# Patient Record
Sex: Female | Born: 1995
Health system: Southern US, Community
[De-identification: ages and names within clinical notes are randomized; demographics above are authoritative.]

## PROBLEM LIST (undated history)

## (undated) DIAGNOSIS — J45909 Unspecified asthma, uncomplicated: Secondary | ICD-10-CM

## (undated) DIAGNOSIS — E282 Polycystic ovarian syndrome: Secondary | ICD-10-CM

## (undated) DIAGNOSIS — H7293 Unspecified perforation of tympanic membrane, bilateral: Secondary | ICD-10-CM

## (undated) DIAGNOSIS — J329 Chronic sinusitis, unspecified: Secondary | ICD-10-CM

## (undated) DIAGNOSIS — Q386 Other congenital malformations of mouth: Secondary | ICD-10-CM

## (undated) HISTORY — DX: Unspecified perforation of tympanic membrane, bilateral: H72.93

## (undated) HISTORY — DX: Chronic sinusitis, unspecified: J32.9

## (undated) HISTORY — DX: Other congenital malformations of mouth: Q38.6

## (undated) HISTORY — DX: Unspecified asthma, uncomplicated: J45.909

## (undated) HISTORY — DX: Polycystic ovarian syndrome: E28.2

---

## 2015-08-13 ENCOUNTER — Other Ambulatory Visit (HOSPITAL_COMMUNITY)
Admission: RE | Admit: 2015-08-13 | Discharge: 2015-08-13 | Disposition: A | Discharge status: Home / Self Care | Payer: BLUE CROSS/BLUE SHIELD | Source: Ambulatory Visit | Attending: Obstetrics and Gynecology | Admitting: Obstetrics and Gynecology

## 2015-08-13 DIAGNOSIS — Z01419 Encounter for gynecological examination (general) (routine) without abnormal findings: Secondary | ICD-10-CM | POA: Insufficient documentation

## 2015-08-13 DIAGNOSIS — E282 Polycystic ovarian syndrome: Secondary | ICD-10-CM

## 2015-08-13 HISTORY — DX: Polycystic ovarian syndrome: E28.2

## 2015-08-20 DIAGNOSIS — Q386 Other congenital malformations of mouth: Secondary | ICD-10-CM

## 2015-08-20 HISTORY — DX: Other congenital malformations of mouth: Q38.6

## 2015-11-13 DIAGNOSIS — J45909 Unspecified asthma, uncomplicated: Secondary | ICD-10-CM

## 2015-11-13 HISTORY — DX: Unspecified asthma, uncomplicated: J45.909

## 2016-09-21 ENCOUNTER — Other Ambulatory Visit: Payer: Self-pay | Admitting: Family Medicine

## 2016-09-21 ENCOUNTER — Ambulatory Visit
Admission: RE | Admit: 2016-09-21 | Discharge: 2016-09-21 | Disposition: A | Discharge status: Home / Self Care | Payer: BLUE CROSS/BLUE SHIELD | Source: Ambulatory Visit | Attending: Family Medicine | Admitting: Family Medicine

## 2016-09-21 DIAGNOSIS — R079 Chest pain, unspecified: Secondary | ICD-10-CM

## 2017-04-21 DIAGNOSIS — H7293 Unspecified perforation of tympanic membrane, bilateral: Secondary | ICD-10-CM

## 2017-04-21 DIAGNOSIS — J329 Chronic sinusitis, unspecified: Secondary | ICD-10-CM

## 2017-04-21 HISTORY — DX: Chronic sinusitis, unspecified: J32.9

## 2017-04-21 HISTORY — DX: Unspecified perforation of tympanic membrane, bilateral: H72.93

## 2017-05-09 DIAGNOSIS — H7293 Unspecified perforation of tympanic membrane, bilateral: Secondary | ICD-10-CM | POA: Diagnosis not present

## 2017-05-09 DIAGNOSIS — H900 Conductive hearing loss, bilateral: Secondary | ICD-10-CM | POA: Diagnosis not present

## 2017-05-29 ENCOUNTER — Encounter: Payer: Self-pay | Admitting: Physical Therapy

## 2017-05-29 ENCOUNTER — Ambulatory Visit: Payer: BC Managed Care – PPO | Admitting: Physical Therapy

## 2017-05-29 DIAGNOSIS — M62830 Muscle spasm of back: Secondary | ICD-10-CM

## 2017-05-29 DIAGNOSIS — G8929 Other chronic pain: Secondary | ICD-10-CM

## 2017-05-29 DIAGNOSIS — M25511 Pain in right shoulder: Secondary | ICD-10-CM

## 2017-05-29 DIAGNOSIS — M25512 Pain in left shoulder: Secondary | ICD-10-CM | POA: Diagnosis not present

## 2017-05-30 NOTE — Therapy (Signed)
[facility] [address] [place], [place], [address] Phone: [phone]   Fax:  [phone]  Physical Therapy Evaluation  Patient Details  Name: Abigail PerkingKristina Wood MRN: [id number] Date of Birth: 04/12/1996 Referring Provider: Mila PalmerSharon Wolters   Encounter Date: 05/29/2017  PT End of Session - 05/30/17 1224    Visit Number  1    Number of Visits  12    Date for PT Re-Evaluation  07/10/17    Authorization Type  BCBS    PT Start Time  1600    PT Stop Time  1653    PT Time Calculation (min)  53 min    Activity Tolerance  Patient tolerated treatment well    Behavior During Therapy  [facility] for tasks assessed/performed       History reviewed. No pertinent past medical history.  History reviewed. No pertinent surgical history.  There were no vitals filed for this visit.   Subjective Assessment - 05/29/17 1607    Subjective  Pt initially had pain when she was 22 y/o, did have PT then, no significant injury to report. She is working as a Runner, broadcasting/film/videoteacher . She is R handed. States increased soreness in R upper trap region. Unable to pinpoint what brings pain on. She has seen PCP only for this issue .     Limitations  Reading;Lifting;Writing;House hold activities    Patient Stated Goals  Decreased pain     Currently in Pain?  Yes    Pain Score  7     Pain Location  Shoulder    Pain Orientation  Right    Pain Descriptors / Indicators  Tightness;Tender    Pain Type  Chronic pain    Pain Onset  More than a month ago    Pain Frequency  Intermittent         [facility] - 05/30/17 1227      Assessment   Medical Diagnosis  R shoulder pain     Referring Provider  Mila PalmerSharon Wolters    Hand Dominance  Right      Precautions   Precautions  None      Restrictions   Weight Bearing Restrictions  No      Balance Screen   Has the patient fallen in the past 6 months  No      Prior Function   Level of Independence  Independent      Cognition    Overall Cognitive Status  Within Functional Limits for tasks assessed      Posture/Postural Control   Posture Comments  Palpable trigger point in R upper trap, tightness and restrictions in upper trap, levator and scalenes.        ROM / Strength   AROM / PROM / Strength  AROM;Strength      AROM   Overall AROM Comments  Cervical ROM: mild limitation for R rotation and R SB      Strength   Overall Strength Comments  UE strength: 4/5             Objective measurements completed on examination: See above findings.      [facility] Treatment/Exercise - 05/30/17 1227      Exercises   Exercises  Neck      Neck Exercises: Standing   Other Standing Exercises  Rows x20      Neck Exercises: Seated   Neck Retraction  20 reps    Shoulder Rolls  20 reps  Manual Therapy   Manual Therapy  Joint mobilization;Soft tissue mobilization;Taping;Passive ROM;Manual Traction    Joint Mobilization  PA mobs-cervical    Soft tissue mobilization  DTM to R UT    Passive ROM  UT stretches    Manual Traction  10 sec x5    Kinesiotex  Inhibit Muscle R UT inhibition      Neck Exercises: Stretches   Upper Trapezius Stretch  3 reps;30 seconds    Levator Stretch  3 reps;30 seconds             PT Education - 05/30/17 1223    Education provided  Yes    Education Details  HEP, use of Heat, posture     Person(s) Educated  Patient    Methods  Explanation;Verbal cues;Handout    Comprehension  Verbalized understanding       PT Short Term Goals - 05/29/17 1646      PT SHORT TERM GOAL #1   Title  Pt to be independent with HEP    Time  2    Period  Weeks    Status  New    Target Date  06/12/17      PT SHORT TERM GOAL #2   Title  Pt to report decreased pain to 4/10     Time  2    Period  Weeks    Status  New    Target Date  06/12/17        PT Long Term Goals - 05/29/17 1646      PT LONG TERM GOAL #1   Title  Pt to report decreased pain to 0-2/10 in R cervical region     Time  6    Period  Weeks    Status  New    Target Date  07/10/17      PT LONG TERM GOAL #2   Title  Pt to demo improved cervical ROM, to be WNL and pain free , to improve abilitiy for work duties     Time  6    Period  Weeks    Status  New    Target Date  07/10/17      PT LONG TERM GOAL #3   Title  Pt to demo decreased tenderness and presence of trigger points at R upper trap region    Time  6    Period  Weeks    Status  New    Target Date  07/10/17      PT LONG TERM GOAL #4   Title  Pt to demo improved shoulder and scapular strength to be at least 4+/5 to improve stability and pain.     Time  6    Period  Weeks    Status  New    Target Date  07/10/17             Plan - 05/30/17 1225    Clinical Impression Statement  Clinical Impression Statement   Pt presents with primary complaint of increased pain in R upper trap region. She has mild ROM loss in c-spine, due to pain, and poor strength for postural muscles. She has painful trigger points in R upper trap, and hypertonicity of upper trap, levator, and scalenes. Pt with decreased ability for full functional activities with UE, due to pain, lifting, carrying, and work duties. Pto benefit from skilled physical therapy to address above deficits and return to PLOF without pain. Prognosis for recovery is good. Home exercise program issued  today.     Clinical Presentation  Stable    Clinical Decision Making  Low    Rehab Potential  Good    PT Frequency  2x / week    PT Duration  6 weeks    PT Treatment/Interventions  ADLs/Self Care Home Management;Cryotherapy;Electrical Stimulation;Iontophoresis 4mg /ml Dexamethasone;Moist Heat;Therapeutic exercise;Therapeutic activities;Functional mobility training;Ultrasound;Neuromuscular re-education;Patient/family education;Manual techniques;Taping;Dry needling;Passive range of motion    Consulted and Agree with Plan of Care  Patient       Patient will benefit from skilled therapeutic  intervention in order to improve the following deficits and impairments:  Hypomobility, Decreased strength, Pain, Increased muscle spasms, Decreased mobility, Decreased range of motion, Postural dysfunction, Improper body mechanics, Impaired flexibility  Visit Diagnosis: Muscle spasm of back - Plan: PT plan of care cert/re-cert  Chronic right shoulder pain - Plan: PT plan of care cert/re-cert     Problem List There are no active problems to display for this patient.   Sedalia Muta, PT, DPT 2:17 PM  05/30/17    [facility] [address] [place], [place], [address] Phone: [phone]   Fax:  [phone]  Name: Abigail Wood MRN: [id number] Date of Birth: 1996/04/15

## 2017-06-05 ENCOUNTER — Ambulatory Visit: Payer: BC Managed Care – PPO | Admitting: Physical Therapy

## 2017-06-05 DIAGNOSIS — M62830 Muscle spasm of back: Secondary | ICD-10-CM | POA: Diagnosis not present

## 2017-06-05 DIAGNOSIS — G8929 Other chronic pain: Secondary | ICD-10-CM | POA: Diagnosis not present

## 2017-06-05 DIAGNOSIS — M25511 Pain in right shoulder: Secondary | ICD-10-CM | POA: Diagnosis not present

## 2017-06-05 NOTE — Therapy (Signed)
[facility] [address] [place], [place], [address] Phone: [phone]   Fax:  [phone]  Physical Therapy Treatment  Patient Details  Name: Abigail Wood MRN: [id number] Date of Birth: 05/15/1995 Referring Provider: Mila PalmerSharon Wolters   Encounter Date: 06/05/2017  PT End of Session - 06/05/17 2037    Visit Number  2    Number of Visits  12    Date for PT Re-Evaluation  07/10/17    Authorization Type  BCBS    PT Start Time  1515    PT Stop Time  1605    PT Time Calculation (min)  50 min    Activity Tolerance  Patient tolerated treatment well    Behavior During Therapy  [facility] for tasks assessed/performed       No past medical history on file.  No past surgical history on file.  There were no vitals filed for this visit.  Subjective Assessment - 06/05/17 2035    Subjective  Pt states minimal pain in last few days. She has been doing HEP ,states increased soreness mostly with stress at work.     Currently in Pain?  No/denies                      [facility]/Ex[facility] - 06/05/17 1521      Posture/Postural Control   Posture Comments  --      Exercises   Exercises  Neck;Shoulder      Neck Exercises: Standing   Other Standing Exercises  --      Neck Exercises: Seated   Neck Retraction  20 reps    Shoulder Rolls  --      Shoulder Exercises: Supine   Flexion  AROM;Limitations    Flexion Limitations  cane      Shoulder Exercises: Prone   Extension  15 reps    Horizontal ABduction 1  15 reps      Shoulder Exercises: Sidelying   ABduction  15 reps      Shoulder Exercises: Standing   Row  20 reps;Theraband    Theraband Level (Shoulder Row)  Level 2 (Red)    Other Standing Exercises  Wall Push ups x20    Other Standing Exercises  Reverse Fly YTB 2x10      Shoulder Exercises: Pulleys   Flexion  2 minutes      Manual Therapy   Manual Therapy  Joint mobilization;Soft tissue  mobilization;Taping;Passive ROM;Manual Traction    Joint Mobilization  PA mobs-cervical    Soft tissue mobilization  DTM to R UT and parasinals, sub occipital release    Passive ROM  --    Manual Traction  10 sec x5    Kinesiotex  Inhibit Muscle R UT inhibition      Kinesiotix   Inhibit Muscle   I Strip R UT      Neck Exercises: Stretches   Upper Trapezius Stretch  3 reps;30 seconds    Levator Stretch  3 reps;30 seconds             PT Education - 06/05/17 2037    Education provided  Yes    Education Details  Posture    Methods  Explanation    Comprehension  Verbalized understanding       PT Short Term Goals - 05/29/17 1646      PT SHORT TERM GOAL #1   Title  Pt to be independent with HEP    Time  2    Period  Weeks    Status  New    Target Date  06/12/17      PT SHORT TERM GOAL #2   Title  Pt to report decreased pain to 4/10     Time  2    Period  Weeks    Status  New    Target Date  06/12/17        PT Long Term Goals - 05/29/17 1646      PT LONG TERM GOAL #1   Title  Pt to report decreased pain to 0-2/10 in R cervical region    Time  6    Period  Weeks    Status  New    Target Date  07/10/17      PT LONG TERM GOAL #2   Title  Pt to demo improved cervical ROM, to be WNL and pain free , to improve abilitiy for work duties     Time  6    Period  Weeks    Status  New    Target Date  07/10/17      PT LONG TERM GOAL #3   Title  Pt to demo decreased tenderness and presence of trigger points at R upper trap region    Time  6    Period  Weeks    Status  New    Target Date  07/10/17      PT LONG TERM GOAL #4   Title  Pt to demo improved shoulder and scapular strength to be at least 4+/5 to improve stability and pain.     Time  6    Period  Weeks    Status  New    Target Date  07/10/17            Plan - 06/05/17 2039    Clinical Impression Statement  Pt with painful trigger point in R upper trap, addressed with manual and taping today, pt  may benefit from dry needling in future (if she can work out session with her schedule). Pt educated on importance of posture during work and shoulder AROM. Pt with difficulty not getting compensation from UT with shoulder elevation. Weakness also noted in scapular muscles, will benefit from continued practice with this.     Rehab Potential  Good    PT Frequency  2x / week    PT Duration  6 weeks    PT Treatment/Interventions  ADLs/Self Care Home Management;Cryotherapy;Electrical Stimulation;Iontophoresis 4mg /ml Dexamethasone;Moist Heat;Therapeutic exercise;Therapeutic activities;Functional mobility training;Ultrasound;Neuromuscular re-education;Patient/family education;Manual techniques;Taping;Dry needling;Passive range of motion    Consulted and Agree with Plan of Care  Patient       Patient will benefit from skilled therapeutic intervention in order to improve the following deficits and impairments:  Hypomobility, Decreased strength, Pain, Increased muscle spasms, Decreased mobility, Decreased range of motion, Postural dysfunction, Improper body mechanics, Impaired flexibility  Visit Diagnosis: Muscle spasm of back  Chronic right shoulder pain     Problem List There are no active problems to display for this patient.  Sedalia Muta, PT, DPT 8:42 PM  06/05/17    [facility] [address] [place], [place], [address] Phone: [phone]   Fax:  [phone]  Name: Abigail Wood MRN: [id number] Date of Birth: 1995-05-27

## 2017-06-12 ENCOUNTER — Encounter: Payer: Self-pay | Admitting: Physical Therapy

## 2017-06-12 ENCOUNTER — Ambulatory Visit: Payer: BC Managed Care – PPO | Admitting: Physical Therapy

## 2017-06-12 DIAGNOSIS — M62830 Muscle spasm of back: Secondary | ICD-10-CM

## 2017-06-12 DIAGNOSIS — G8929 Other chronic pain: Secondary | ICD-10-CM | POA: Diagnosis not present

## 2017-06-12 DIAGNOSIS — M25511 Pain in right shoulder: Secondary | ICD-10-CM

## 2017-06-12 NOTE — Therapy (Signed)
[facility] [address] [place], [place], [address] Phone: [phone]   Fax:  [phone]  Physical Therapy Treatment  Patient Details  Name: Abigail PerkingKristina Selke MRN: [id number] Date of Birth: 03/28/1996 Referring Provider: Mila PalmerSharon Wolters   Encounter Date: 06/12/2017  PT End of Session - 06/12/17 1529    Visit Number  3    Number of Visits  12    Date for PT Re-Evaluation  07/10/17    Authorization Type  BCBS    PT Start Time  1520    PT Stop Time  1618    PT Time Calculation (min)  58 min    Activity Tolerance  Patient tolerated treatment well    Behavior During Therapy  [facility] for tasks assessed/performed       History reviewed. No pertinent past medical history.  History reviewed. No pertinent surgical history.  There were no vitals filed for this visit.  Subjective Assessment - 06/12/17 1527    Subjective  Pt states mild soreness on L side of neck.     Currently in Pain?  No/denies    Pain Score  0-No pain    Pain Descriptors / Indicators  Tightness;Tender    Pain Type  Chronic pain    Pain Onset  More than a month ago    Pain Frequency  Intermittent                      [facility] Adult PT Treatment/Exercise - 06/12/17 1518      Exercises   Exercises  Neck;Shoulder      Neck Exercises: Seated   Neck Retraction  20 reps      Shoulder Exercises: Supine   Flexion  AROM;Limitations;20 reps    Shoulder Flexion Weight (lbs)  2    Flexion Limitations  --      Shoulder Exercises: Prone   Retraction  20 reps;Weights    Retraction Weight (lbs)  3    Extension  20 reps    Horizontal ABduction 1  20 reps      Shoulder Exercises: Sidelying   ABduction  20 reps    Other Sidelying Exercises  Horizontal ABD x15      Shoulder Exercises: Standing   Flexion  20 reps    ABduction  10 reps    Row  20 reps;Theraband    Theraband Level (Shoulder Row)  Level 2 (Red)    Other Standing Exercises  Wall Push ups x20    Other Standing Exercises  Reverse Fly YTB 2x10      Shoulder Exercises: Pulleys   Flexion  2 minutes      Manual Therapy   Manual Therapy  Joint mobilization;Soft tissue mobilization;Taping;Passive ROM;Manual Traction    Joint Mobilization  PA mobs-cervical    Soft tissue mobilization  DTM to B UT and parasinals, sub occipital release    Passive ROM  UT and levator stretches    Manual Traction  10 sec x5    Kinesiotex  -- R UT inhibition      Kinesiotix   Inhibit Muscle   --      Neck Exercises: Stretches   Upper Trapezius Stretch  --    Levator Stretch  --             PT Education - 06/12/17 1529    Education provided  Yes    Education Details  Posture, HEP     Person(s) Educated  Patient  Methods  Explanation    Comprehension  Verbalized understanding       PT Short Term Goals - 05/29/17 1646      PT SHORT TERM GOAL #1   Title  Pt to be independent with HEP    Time  2    Period  Weeks    Status  New    Target Date  06/12/17      PT SHORT TERM GOAL #2   Title  Pt to report decreased pain to 4/10     Time  2    Period  Weeks    Status  New    Target Date  06/12/17        PT Long Term Goals - 05/29/17 1646      PT LONG TERM GOAL #1   Title  Pt to report decreased pain to 0-2/10 in R cervical region    Time  6    Period  Weeks    Status  New    Target Date  07/10/17      PT LONG TERM GOAL #2   Title  Pt to demo improved cervical ROM, to be WNL and pain free , to improve abilitiy for work duties     Time  6    Period  Weeks    Status  New    Target Date  07/10/17      PT LONG TERM GOAL #3   Title  Pt to demo decreased tenderness and presence of trigger points at R upper trap region    Time  6    Period  Weeks    Status  New    Target Date  07/10/17      PT LONG TERM GOAL #4   Title  Pt to demo improved shoulder and scapular strength to be at least 4+/5 to improve stability and pain.     Time  6    Period  Weeks    Status  New     Target Date  07/10/17            Plan - 06/12/17 1628    Clinical Impression Statement  Pt with trigger point noted at R UT, but pain and tenderness decreased from previous sessions. Pt able to progress strengthening for UE today, with education and cuing for posture and shoulder stability during movements. Pt to benefit from conitnued/progressive strengthening, she is challenged with exercises today, due to muscle fatigue and weakness.     Rehab Potential  Good    PT Frequency  2x / week    PT Duration  6 weeks    PT Treatment/Interventions  ADLs/Self Care Home Management;Cryotherapy;Electrical Stimulation;Iontophoresis 4mg /ml Dexamethasone;Moist Heat;Therapeutic exercise;Therapeutic activities;Functional mobility training;Ultrasound;Neuromuscular re-education;Patient/family education;Manual techniques;Taping;Dry needling;Passive range of motion    Consulted and Agree with Plan of Care  Patient       Patient will benefit from skilled therapeutic intervention in order to improve the following deficits and impairments:  Hypomobility, Decreased strength, Pain, Increased muscle spasms, Decreased mobility, Decreased range of motion, Postural dysfunction, Improper body mechanics, Impaired flexibility  Visit Diagnosis: Muscle spasm of back  Chronic right shoulder pain     Problem List There are no active problems to display for this patient.   Sedalia Muta, PT, DPT 4:30 PM  06/12/17    [facility] [address] [place], [place], [address] Phone: [phone]   Fax:  [phone]  Name: Sebastiana Wuest MRN: [id number] Date of  Birth: 05/25/1995

## 2017-06-14 ENCOUNTER — Other Ambulatory Visit: Payer: Self-pay | Admitting: Physical Therapy

## 2017-06-14 ENCOUNTER — Encounter: Payer: Self-pay | Admitting: Physical Therapy

## 2017-06-22 ENCOUNTER — Encounter: Payer: Self-pay | Admitting: Physical Therapy

## 2017-06-22 ENCOUNTER — Ambulatory Visit: Payer: BC Managed Care – PPO | Admitting: Physical Therapy

## 2017-06-22 DIAGNOSIS — M62830 Muscle spasm of back: Secondary | ICD-10-CM | POA: Diagnosis not present

## 2017-06-22 DIAGNOSIS — M25511 Pain in right shoulder: Secondary | ICD-10-CM

## 2017-06-22 DIAGNOSIS — G8929 Other chronic pain: Secondary | ICD-10-CM | POA: Diagnosis not present

## 2017-06-23 NOTE — Therapy (Signed)
[facility] [address] [place], [place], [address] Phone: [phone]   Fax:  [phone]  Physical Therapy Treatment  Patient Details  Name: Abigail Wood MRN: [id number] Date of Birth: 1995/10/20 Referring Provider: Mila Palmer   Encounter Date: 06/22/2017  PT End of Session - 06/23/17 1323    Visit Number  4    Number of Visits  12    Date for PT Re-Evaluation  07/10/17    Authorization Type  BCBS    PT Start Time  1517    PT Stop Time  1605    PT Time Calculation (min)  48 min    Activity Tolerance  Patient tolerated treatment well    Behavior During Therapy  [facility] for tasks assessed/performed       Past Medical History:  Diagnosis Date  . Asthma 11/13/2015  . 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 Perforated ear drum, bilateral 04/21/2017  . Polycystic ovarian syndrome 08/13/2015  . Sinusitis 04/21/2017    History reviewed. No pertinent surgical history.  There were no vitals filed for this visit.  Subjective Assessment - 06/22/17 1522    Subjective  Pt has been sick, with bronchitis. Neck has been sore.     Currently in Pain?  Yes    Pain Score  4     Pain Location  Shoulder    Pain Orientation  Right    Pain Descriptors / Indicators  Tightness;Tender    Pain Onset  More than a month ago    Pain Frequency  Intermittent                      [facility] Adult PT Treatment/Exercise - 06/23/17 1338      Neck Exercises: Seated   Neck Retraction  20 reps    Shoulder Rolls  20 reps    Other Seated Exercise  Cervical Nod x10      Shoulder Exercises: Supine   Flexion  AROM;Limitations;20 reps    Shoulder Flexion Weight (lbs)  2      Shoulder Exercises: Prone   Retraction  20 reps;Weights    Retraction Weight (lbs)  3    Extension  20 reps    Horizontal ABduction 1  20 reps      Shoulder Exercises: Sidelying   ABduction  20 reps      Shoulder Exercises: Standing   Row  20 reps;Theraband    Theraband  Level (Shoulder Row)  Level 3 (Green)    Other Standing Exercises  Reverse Fly YTB 2x10      Shoulder Exercises: Pulleys   Flexion  2 minutes      Manual Therapy   Soft tissue mobilization  DTM to R UT and parasinals, sub occipital release    Passive ROM  UT and levator stretches      Neck Exercises: Stretches   Upper Trapezius Stretch  3 reps;30 seconds             PT Education - 06/22/17 1523    Education provided  Yes    Education Details  HEP, posture     Person(s) Educated  Patient    Methods  Explanation    Comprehension  Verbalized understanding       PT Short Term Goals - 05/29/17 1646      PT SHORT TERM GOAL #1   Title  Pt to be independent with HEP    Time  2    Period  Weeks    Status  New    Target Date  06/12/17      PT SHORT TERM GOAL #2   Title  Pt to report decreased pain to 4/10     Time  2    Period  Weeks    Status  New    Target Date  06/12/17        PT Long Term Goals - 05/29/17 1646      PT LONG TERM GOAL #1   Title  Pt to report decreased pain to 0-2/10 in R cervical region    Time  6    Period  Weeks    Status  New    Target Date  07/10/17      PT LONG TERM GOAL #2   Title  Pt to demo improved cervical ROM, to be WNL and pain free , to improve abilitiy for work duties     Time  6    Period  Weeks    Status  New    Target Date  07/10/17      PT LONG TERM GOAL #3   Title  Pt to demo decreased tenderness and presence of trigger points at R upper trap region    Time  6    Period  Weeks    Status  New    Target Date  07/10/17      PT LONG TERM GOAL #4   Title  Pt to demo improved shoulder and scapular strength to be at least 4+/5 to improve stability and pain.     Time  6    Period  Weeks    Status  New    Target Date  07/10/17            Plan - 06/23/17 1324    Clinical Impression Statement  Pt with soreness in R UT with trigger points. She was able to perform ther ex for shoulder and scapula strengthening  without increased pain. She is challenged with ther ex today, and will benefit from continued practice with this.     Rehab Potential  Good    PT Frequency  2x / week    PT Duration  6 weeks    PT Treatment/Interventions  ADLs/Self Care Home Management;Cryotherapy;Electrical Stimulation;Iontophoresis 4mg /ml Dexamethasone;Moist Heat;Therapeutic exercise;Therapeutic activities;Functional mobility training;Ultrasound;Neuromuscular re-education;Patient/family education;Manual techniques;Taping;Dry needling;Passive range of motion    Consulted and Agree with Plan of Care  Patient       Patient will benefit from skilled therapeutic intervention in order to improve the following deficits and impairments:  Hypomobility, Decreased strength, Pain, Increased muscle spasms, Decreased mobility, Decreased range of motion, Postural dysfunction, Improper body mechanics, Impaired flexibility  Visit Diagnosis: Muscle spasm of back  Chronic right shoulder pain     Problem List There are no active problems to display for this patient. Sedalia MutaLauren Cayetano Mikita, PT, DPT 1:40 PM  06/23/17    [facility] [address] [place], [place], [address] Phone: [phone]   Fax:  [phone]  Name: Abigail Wood MRN: [id number] Date of Birth: 02/10/1996

## 2017-06-26 ENCOUNTER — Encounter: Payer: Self-pay | Admitting: Physical Therapy

## 2017-06-26 ENCOUNTER — Ambulatory Visit: Payer: BC Managed Care – PPO | Admitting: Physical Therapy

## 2017-06-26 DIAGNOSIS — M62830 Muscle spasm of back: Secondary | ICD-10-CM

## 2017-06-26 DIAGNOSIS — M25511 Pain in right shoulder: Secondary | ICD-10-CM

## 2017-06-26 DIAGNOSIS — G8929 Other chronic pain: Secondary | ICD-10-CM

## 2017-06-26 NOTE — Therapy (Signed)
[facility] [address] [place], [place], [address] Phone: [phone]   Fax:  [phone]  Physical Therapy Treatment  Patient Details  Name: Abigail Wood MRN: [id number] Date of Birth: [birth year] Referring Provider: Mila Palmer   Encounter Date: 06/26/2017  PT End of Session - 06/26/17 1617    Visit Number  5    Number of Visits  12    Date for PT Re-Evaluation  07/10/17    Authorization Type  BCBS    PT Start Time  1517    PT Stop Time  1605    PT Time Calculation (min)  48 min    Activity Tolerance  Patient tolerated treatment well    Behavior During Therapy  [facility] for tasks assessed/performed       Past Medical History:  Diagnosis Date  . Asthma 11/13/2015  . 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 Perforated ear drum, bilateral 04/21/2017  . Polycystic ovarian syndrome 08/13/2015  . Sinusitis 04/21/2017    History reviewed. No pertinent surgical history.  There were no vitals filed for this visit.  Subjective Assessment - 06/26/17 1617    Subjective  Pt states improved pain .     Currently in Pain?  Yes    Pain Score  2     Pain Location  Shoulder    Pain Orientation  Right    Pain Descriptors / Indicators  Tightness;Tender    Pain Type  Chronic pain    Pain Onset  More than a month ago    Pain Frequency  Intermittent                      [facility] Adult PT Treatment/Exercise - 06/26/17 1517      Neck Exercises: Seated   Neck Retraction  20 reps    Shoulder Rolls  --    Other Seated Exercise  Cervical Nod x10      Shoulder Exercises: Supine   Flexion  --    Shoulder Flexion Weight (lbs)  --      Shoulder Exercises: Prone   Retraction  --    Retraction Weight (lbs)  --    Extension  20 reps    Horizontal ABduction 1  20 reps      Shoulder Exercises: Sidelying   ABduction  20 reps;Weights    ABduction Weight (lbs)  2      Shoulder Exercises: Standing   External Rotation  20 reps;Theraband     Theraband Level (Shoulder External Rotation)  Level 2 (Red)    Internal Rotation  20 reps;Theraband    Theraband Level (Shoulder Internal Rotation)  Level 2 (Red)    Row  20 reps;Theraband    Theraband Level (Shoulder Row)  Level 3 (Green)    Other Standing Exercises  Reverse Fly YTB 2x10      Shoulder Exercises: Pulleys   Flexion  2 minutes      Manual Therapy   Soft tissue mobilization  DTM to R UT and parasinals,     Passive ROM  --      Neck Exercises: Stretches   Upper Trapezius Stretch  3 reps;30 seconds             PT Education - 06/26/17 1617    Education provided  Yes    Education Details  HEP     Person(s) Educated  Patient    Methods  Explanation    Comprehension  Verbalized understanding  PT Short Term Goals - 05/29/17 1646      PT SHORT TERM GOAL #1   Title  Pt to be independent with HEP    Time  2    Period  Weeks    Status  New    Target Date  06/12/17      PT SHORT TERM GOAL #2   Title  Pt to report decreased pain to 4/10     Time  2    Period  Weeks    Status  New    Target Date  06/12/17        PT Long Term Goals - 05/29/17 1646      PT LONG TERM GOAL #1   Title  Pt to report decreased pain to 0-2/10 in R cervical region    Time  6    Period  Weeks    Status  New    Target Date  07/10/17      PT LONG TERM GOAL #2   Title  Pt to demo improved cervical ROM, to be WNL and pain free , to improve abilitiy for work duties     Time  6    Period  Weeks    Status  New    Target Date  07/10/17      PT LONG TERM GOAL #3   Title  Pt to demo decreased tenderness and presence of trigger points at R upper trap region    Time  6    Period  Weeks    Status  New    Target Date  07/10/17      PT LONG TERM GOAL #4   Title  Pt to demo improved shoulder and scapular strength to be at least 4+/5 to improve stability and pain.     Time  6    Period  Weeks    Status  New    Target Date  07/10/17            Plan - 06/26/17 1618     Clinical Impression Statement  Pt with improved trigger points on R today, with decreased pain with palpation. She is improving with ability and strength for scapular muscles. Educated on cervical Isometrics today. Pt showing good progress     Rehab Potential  Good    PT Frequency  2x / week    PT Duration  6 weeks    PT Treatment/Interventions  ADLs/Self Care Home Management;Cryotherapy;Electrical Stimulation;Iontophoresis 4mg /ml Dexamethasone;Moist Heat;Therapeutic exercise;Therapeutic activities;Functional mobility training;Ultrasound;Neuromuscular re-education;Patient/family education;Manual techniques;Taping;Dry needling;Passive range of motion    Consulted and Agree with Plan of Care  Patient       Patient will benefit from skilled therapeutic intervention in order to improve the following deficits and impairments:  Hypomobility, Decreased strength, Pain, Increased muscle spasms, Decreased mobility, Decreased range of motion, Postural dysfunction, Improper body mechanics, Impaired flexibility  Visit Diagnosis: Muscle spasm of back  Chronic right shoulder pain     Problem List There are no active problems to display for this patient.   Sedalia MutaLauren Asra Gambrel, PT, DPT 4:21 PM  06/26/17    [facility] [address] [address] [place], [place], [address] Phone: [phone]   Fax:  [phone]  Name: Abigail Wood MRN: [id number] Date of Birth: 07/18/1995

## 2017-07-03 ENCOUNTER — Ambulatory Visit: Payer: BC Managed Care – PPO | Admitting: Physical Therapy

## 2017-07-03 DIAGNOSIS — M25511 Pain in right shoulder: Secondary | ICD-10-CM | POA: Diagnosis not present

## 2017-07-03 DIAGNOSIS — G8929 Other chronic pain: Secondary | ICD-10-CM

## 2017-07-03 DIAGNOSIS — M62830 Muscle spasm of back: Secondary | ICD-10-CM | POA: Diagnosis not present

## 2017-07-03 NOTE — Therapy (Signed)
[facility] [address] [place], [place], [address] Phone: [phone]   Fax:  [phone]  Physical Therapy Treatment  Patient Details  Name: Abigail Wood MRN: [id number] Date of Birth: [birth year] Referring Provider: Mila Palmer   Encounter Date: 07/03/2017  PT End of Session - 07/03/17 1525    Visit Number  6    Number of Visits  12    Date for PT Re-Evaluation  07/10/17    Authorization Type  BCBS    PT Start Time  1523    PT Stop Time  1605    PT Time Calculation (min)  42 min    Activity Tolerance  Patient tolerated treatment well    Behavior During Therapy  [facility] for tasks assessed/performed       Past Medical History:  Diagnosis Date  . Asthma 11/13/2015  . 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 Perforated ear drum, bilateral 04/21/2017  . Polycystic ovarian syndrome 08/13/2015  . Sinusitis 04/21/2017    History reviewed. No pertinent surgical history.  There were no vitals filed for this visit.  Subjective Assessment - 07/03/17 1525    Subjective  Pt states no pain in last few days     Currently in Pain?  No/denies                      [facility] Adult PT Treatment/Exercise - 07/03/17 1525      Neck Exercises: Seated   Neck Retraction  20 reps    Other Seated Exercise  Cervical Nod x10      Shoulder Exercises: Prone   Extension  20 reps    Horizontal ABduction 1  20 reps      Shoulder Exercises: Sidelying   ABduction  20 reps;Weights    ABduction Weight (lbs)  2      Shoulder Exercises: Standing   External Rotation  20 reps;Theraband    Theraband Level (Shoulder External Rotation)  Level 2 (Red)    Internal Rotation  20 reps;Theraband    Theraband Level (Shoulder Internal Rotation)  Level 2 (Red)    Flexion  10 reps;Limitations    Flexion Limitations  2lb    ABduction  10 reps;Limitations    ABduction Limitations  2lb    Row  20 reps;Theraband    Theraband Level (Shoulder Row)  Level 3 (Green)     Other Standing Exercises  Wall Push ups x20     Other Standing Exercises  Reverse Fly YTB 2x10      Shoulder Exercises: Pulleys   Flexion  2 minutes      Manual Therapy   Soft tissue mobilization  DTM to R UT and parasinals,       Neck Exercises: Stretches   Upper Trapezius Stretch  3 reps;30 seconds             PT Education - 07/03/17 1525    Education provided  Yes    Education Details  HEP     Person(s) Educated  Patient    Methods  Explanation    Comprehension  Verbalized understanding       PT Short Term Goals - 05/29/17 1646      PT SHORT TERM GOAL #1   Title  Pt to be independent with HEP    Time  2    Period  Weeks    Status  New    Target Date  06/12/17      PT SHORT TERM  GOAL #2   Title  Pt to report decreased pain to 4/10     Time  2    Period  Weeks    Status  New    Target Date  06/12/17        PT Long Term Goals - 05/29/17 1646      PT LONG TERM GOAL #1   Title  Pt to report decreased pain to 0-2/10 in R cervical region    Time  6    Period  Weeks    Status  New    Target Date  07/10/17      PT LONG TERM GOAL #2   Title  Pt to demo improved cervical ROM, to be WNL and pain free , to improve abilitiy for work duties     Time  6    Period  Weeks    Status  New    Target Date  07/10/17      PT LONG TERM GOAL #3   Title  Pt to demo decreased tenderness and presence of trigger points at R upper trap region    Time  6    Period  Weeks    Status  New    Target Date  07/10/17      PT LONG TERM GOAL #4   Title  Pt to demo improved shoulder and scapular strength to be at least 4+/5 to improve stability and pain.     Time  6    Period  Weeks    Status  New    Target Date  07/10/17            Plan - 07/03/17 2144    Clinical Impression Statement  Pt showing good improvements. Plan to assess for possible d/c next visit, if pt still having decreased pain. She has mild trigger points in R UT that are sore today, but overall  improving.     Rehab Potential  Good    PT Frequency  2x / week    PT Duration  6 weeks    PT Treatment/Interventions  ADLs/Self Care Home Management;Cryotherapy;Electrical Stimulation;Iontophoresis 4mg /ml Dexamethasone;Moist Heat;Therapeutic exercise;Therapeutic activities;Functional mobility training;Ultrasound;Neuromuscular re-education;Patient/family education;Manual techniques;Taping;Dry needling;Passive range of motion    Consulted and Agree with Plan of Care  Patient       Patient will benefit from skilled therapeutic intervention in order to improve the following deficits and impairments:  Hypomobility, Decreased strength, Pain, Increased muscle spasms, Decreased mobility, Decreased range of motion, Postural dysfunction, Improper body mechanics, Impaired flexibility  Visit Diagnosis: Muscle spasm of back  Chronic right shoulder pain     Problem List There are no active problems to display for this patient.  Sedalia MutaLauren Jailen Lung, PT, DPT 9:45 PM  07/03/17    [facility] [address] [address] [place], [place], [address] Phone: [phone]   Fax:  [phone]  Name: Marlynn PerkingKristina Novak MRN: [id number] Date of Birth: 02/14/1996

## 2017-07-10 ENCOUNTER — Encounter: Payer: Self-pay | Admitting: Physical Therapy

## 2017-07-10 ENCOUNTER — Ambulatory Visit: Payer: BC Managed Care – PPO | Admitting: Physical Therapy

## 2017-07-10 DIAGNOSIS — G8929 Other chronic pain: Secondary | ICD-10-CM

## 2017-07-10 DIAGNOSIS — M25511 Pain in right shoulder: Secondary | ICD-10-CM | POA: Diagnosis not present

## 2017-07-10 DIAGNOSIS — M62830 Muscle spasm of back: Secondary | ICD-10-CM | POA: Diagnosis not present

## 2017-07-10 NOTE — Therapy (Signed)
[facility] [address] [place], [place], [address] Phone: [phone]   Fax:  [phone]  Physical Therapy Treatment/Discharge   Patient Details  Name: Abigail Wood MRN: [id number] Date of Birth: [birth year] Referring Provider: Jonathon Jordan   Encounter Date: 07/10/2017  PT End of Session - 07/10/17 1648    Visit Number  7    Number of Visits  12    Date for PT Re-Evaluation  07/10/17    Authorization Type  BCBS    PT Start Time  7371    PT Stop Time  1645    PT Time Calculation (min)  50 min    Activity Tolerance  Patient tolerated treatment well    Behavior During Therapy  [facility] for tasks assessed/performed       Past Medical History:  Diagnosis Date  . Asthma 11/13/2015  . 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 Perforated ear drum, bilateral 04/21/2017  . Polycystic ovarian syndrome 08/13/2015  . Sinusitis 04/21/2017    History reviewed. No pertinent surgical history.  There were no vitals filed for this visit.  Subjective Assessment - 07/10/17 1557    Subjective  Pt states minimal pain, does have a day here and there where she has soreness or tightness, but usually resolves easily.     Currently in Pain?  No/denies    Pain Score  0-No pain         [facility] PT Assessment - 07/10/17 0001      AROM   Overall AROM Comments  ROM: WNL      Strength   Overall Strength Comments  UE strength: 4+/5                   [facility] Adult PT Treatment/Exercise - 07/10/17 1619      Neck Exercises: Seated   Other Seated Exercise  Cervical Nod x10      Shoulder Exercises: Supine   Flexion  AROM;Limitations;20 reps    Shoulder Flexion Weight (lbs)  2      Shoulder Exercises: Prone   Flexion  20 reps    Extension  20 reps    Horizontal ABduction 1  20 reps;Weights    Horizontal ABduction 1 Weight (lbs)  2      Shoulder Exercises: Sidelying   External Rotation  20 reps;Weights    External Rotation Weight (lbs)  2    ABduction  20 reps;Weights    ABduction Weight (lbs)  2      Shoulder Exercises: Standing   External Rotation  20 reps;Theraband    Theraband Level (Shoulder External Rotation)  Level 3 (Green)    Internal Rotation  20 reps;Theraband    Theraband Level (Shoulder Internal Rotation)  Level 3 (Green)    Flexion  10 reps;Limitations    Flexion Limitations  2lb    ABduction  10 reps;Limitations    ABduction Limitations  2lb    Row  20 reps;Theraband    Theraband Level (Shoulder Row)  Level 3 (Green)    Other Standing Exercises  Wall Push ups x20     Other Standing Exercises  Reverse Fly RTB 2x10      Shoulder Exercises: Pulleys   Flexion  --      Manual Therapy   Soft tissue mobilization  --      Neck Exercises: Stretches   Upper Trapezius Stretch  3 reps;30 seconds             PT Education -  07/10/17 1648    Education provided  Yes    Education Details  HEP,     Person(s) Educated  Patient    Methods  Explanation;Handout;Demonstration;Tactile cues    Comprehension  Verbalized understanding;Returned demonstration       PT Short Term Goals - 07/10/17 1649      PT SHORT TERM GOAL #1   Title  Pt to be independent with HEP    Time  2    Period  Weeks    Status  Achieved      PT SHORT TERM GOAL #2   Title  Pt to report decreased pain to 4/10     Time  2    Period  Weeks    Status  Achieved        PT Long Term Goals - 07/10/17 1649      PT LONG TERM GOAL #1   Title  Pt to report decreased pain to 0-2/10 in R cervical region    Time  6    Period  Weeks    Status  Achieved      PT LONG TERM GOAL #2   Title  Pt to demo improved cervical ROM, to be WNL and pain free , to improve abilitiy for work duties     Time  6    Period  Weeks    Status  Achieved      PT LONG TERM GOAL #3   Title  Pt to demo decreased tenderness and presence of trigger points at R upper trap region    Time  6    Period  Weeks    Status  Achieved      PT LONG TERM GOAL #4   Title   Pt to demo improved shoulder and scapular strength to be at least 4+/5 to improve stability and pain.     Time  6    Period  Weeks    Status  Achieved            Plan - 07/10/17 1649    Clinical Impression Statement  Pt has made good improvements. She has full, painfree ROM for c-spine and shoulder. She has much improved strength and stability of R shoulder and scapular region. She has been educated on final HEP for strengthening and stabilization, pt with good understanding. She has met goals and is ready for d/c to HEP. Pt encouraged to continue to be conscious of posture.     Rehab Potential  Good    PT Frequency  2x / week    PT Duration  6 weeks    PT Treatment/Interventions  ADLs/Self Care Home Management;Cryotherapy;Electrical Stimulation;Iontophoresis 11m/ml Dexamethasone;Moist Heat;Therapeutic exercise;Therapeutic activities;Functional mobility training;Ultrasound;Neuromuscular re-education;Patient/family education;Manual techniques;Taping;Dry needling;Passive range of motion    Consulted and Agree with Plan of Care  Patient       Patient will benefit from skilled therapeutic intervention in order to improve the following deficits and impairments:  Hypomobility, Decreased strength, Pain, Increased muscle spasms, Decreased mobility, Decreased range of motion, Postural dysfunction, Improper body mechanics, Impaired flexibility  Visit Diagnosis: Muscle spasm of back  Chronic right shoulder pain     Problem List There are no active problems to display for this patient.  LLyndee Hensen PT, DPT 4:52 PM  07/10/17   [place] [address]Phone: [phone]  Fax:  [phone] Name: Abigail FinkleMRN: [id number] of Birth: [birth year]  PHYSICAL THERAPY  DISCHARGE SUMMARY  Visits from Start of Care: 7  Plan: Patient agrees to discharge.  Patient goals were met. Patient is being  discharged due to meeting the stated rehab goals.  ?????      Lyndee Hensen, PT, DPT 4:52 PM  07/10/17

## 2018-07-19 IMAGING — CR DG RIBS W/ CHEST 3+V*L*
3 series · 3 of 3 positions shown · non-contrast
Comparison: None.

CLINICAL DATA: Left axillary chest pain.

EXAM:
LEFT RIBS AND CHEST - 3+ VIEW

[w chest pa]
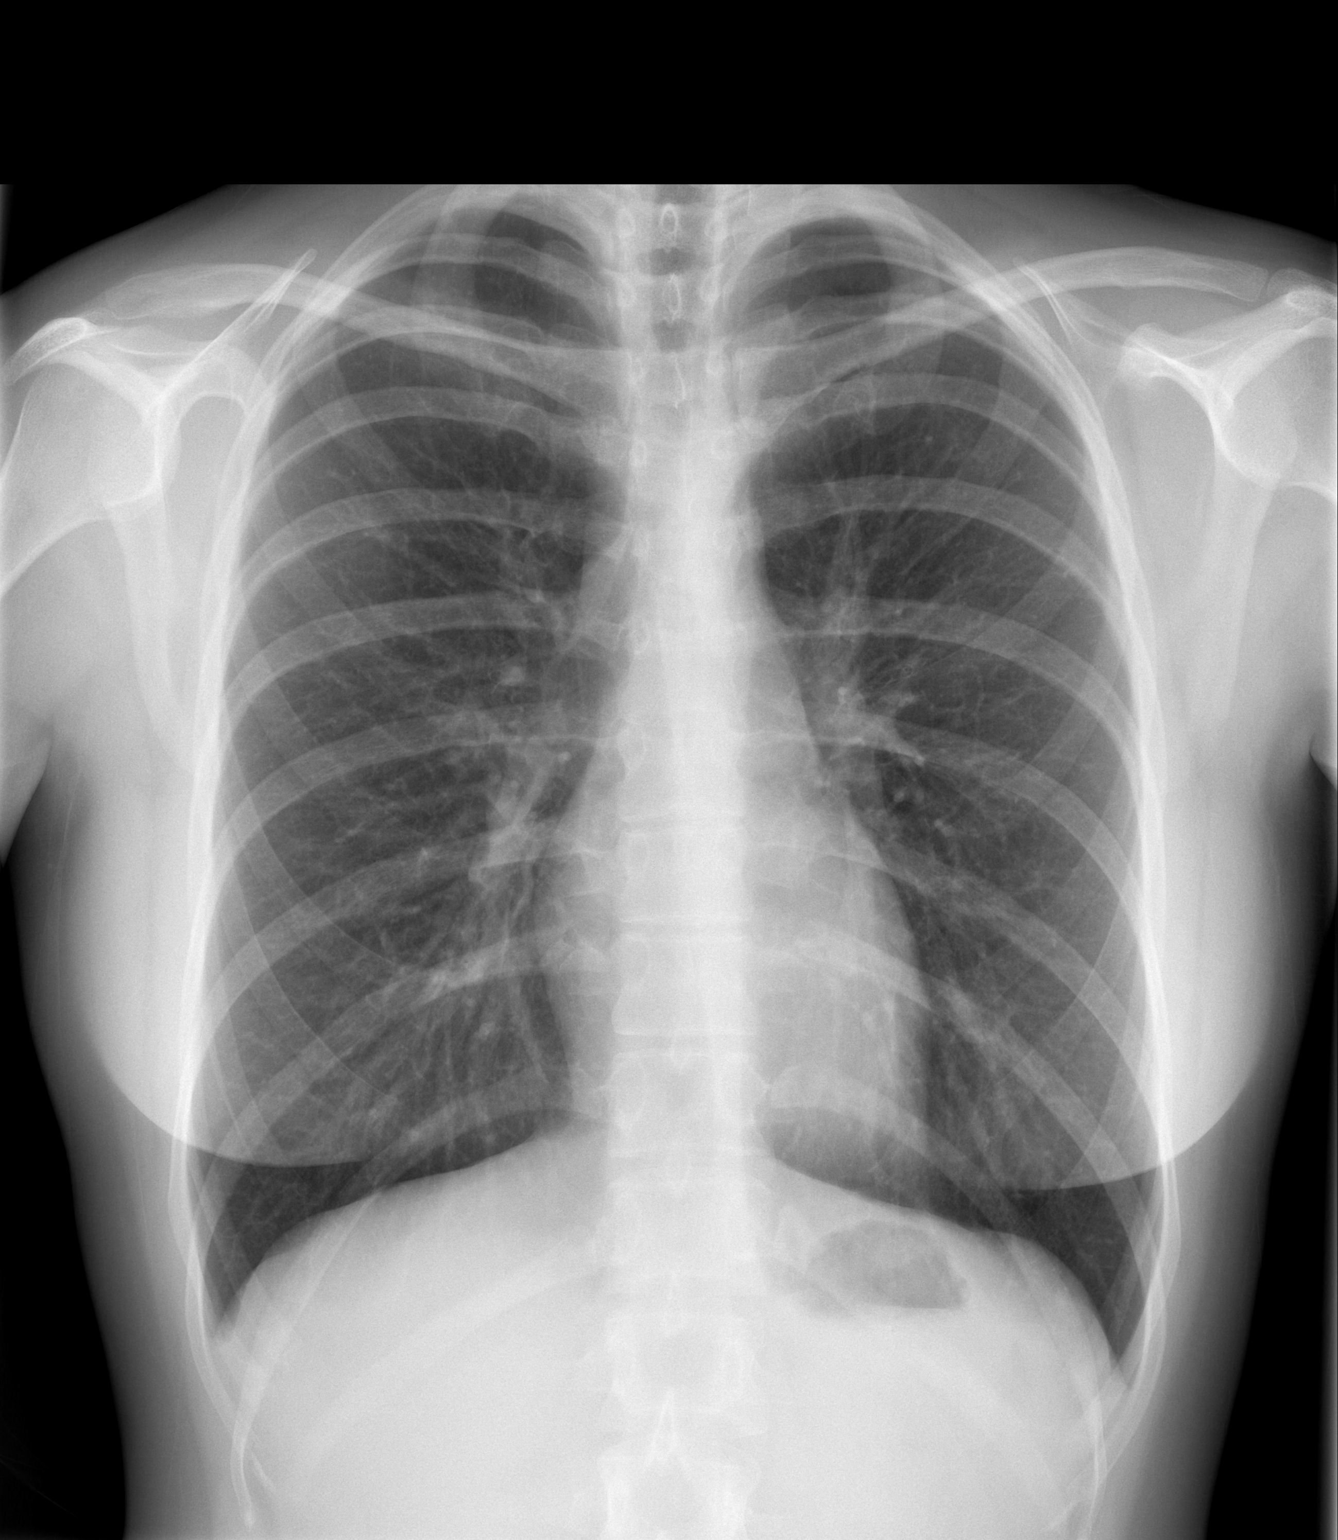

[w ribs ap/pa upper left]
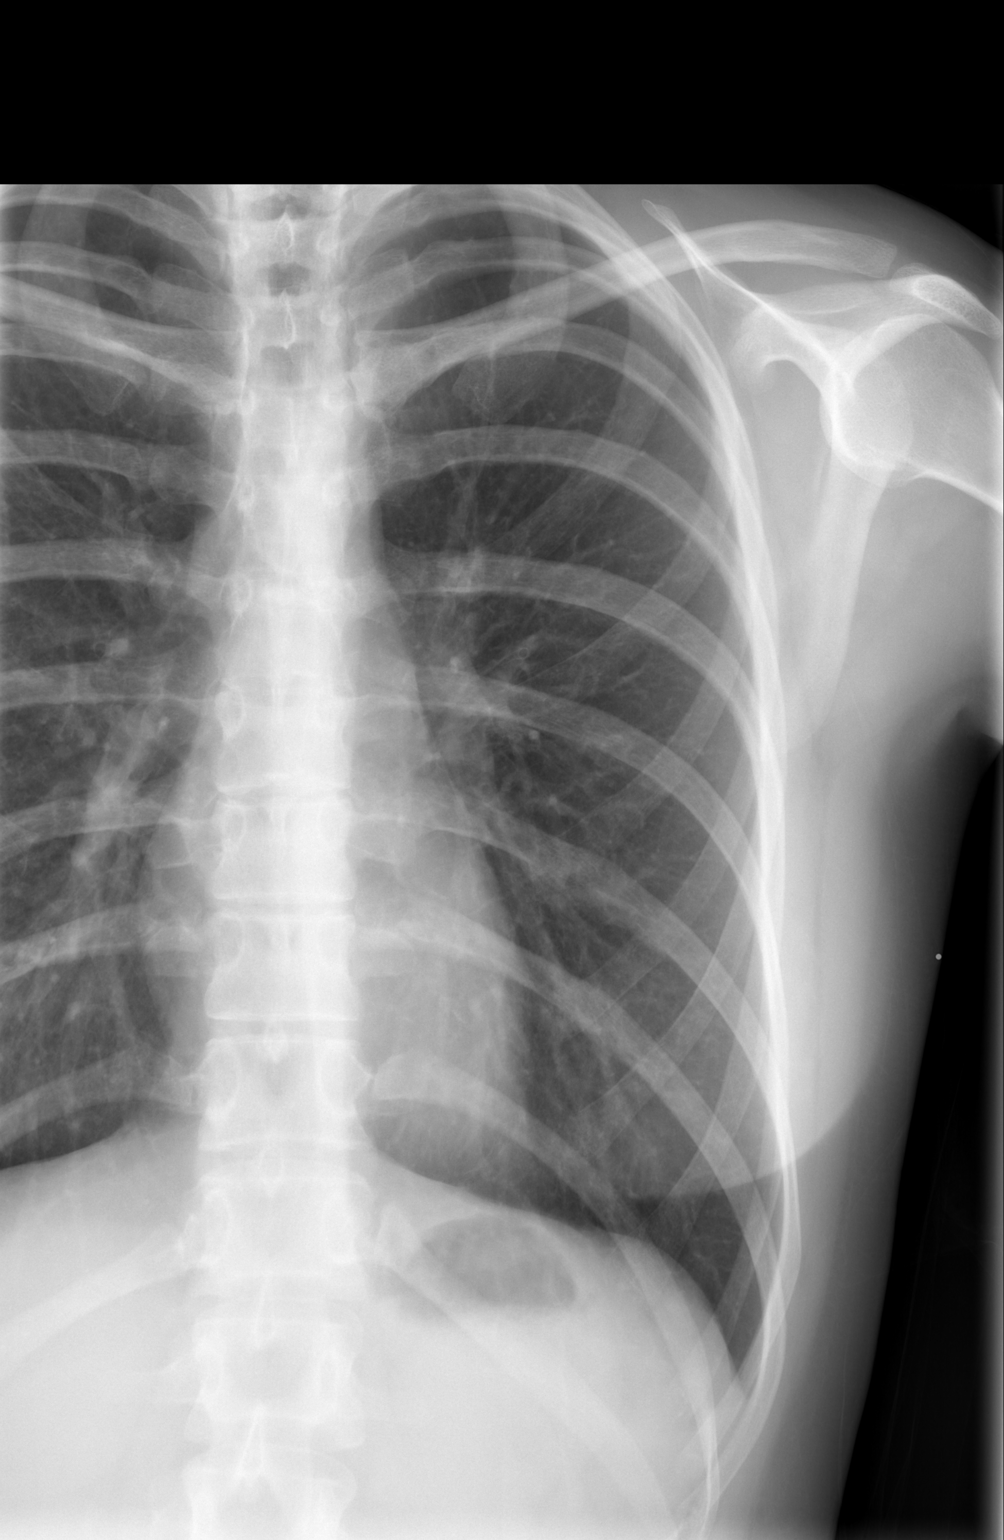

[w ribs oblique left]
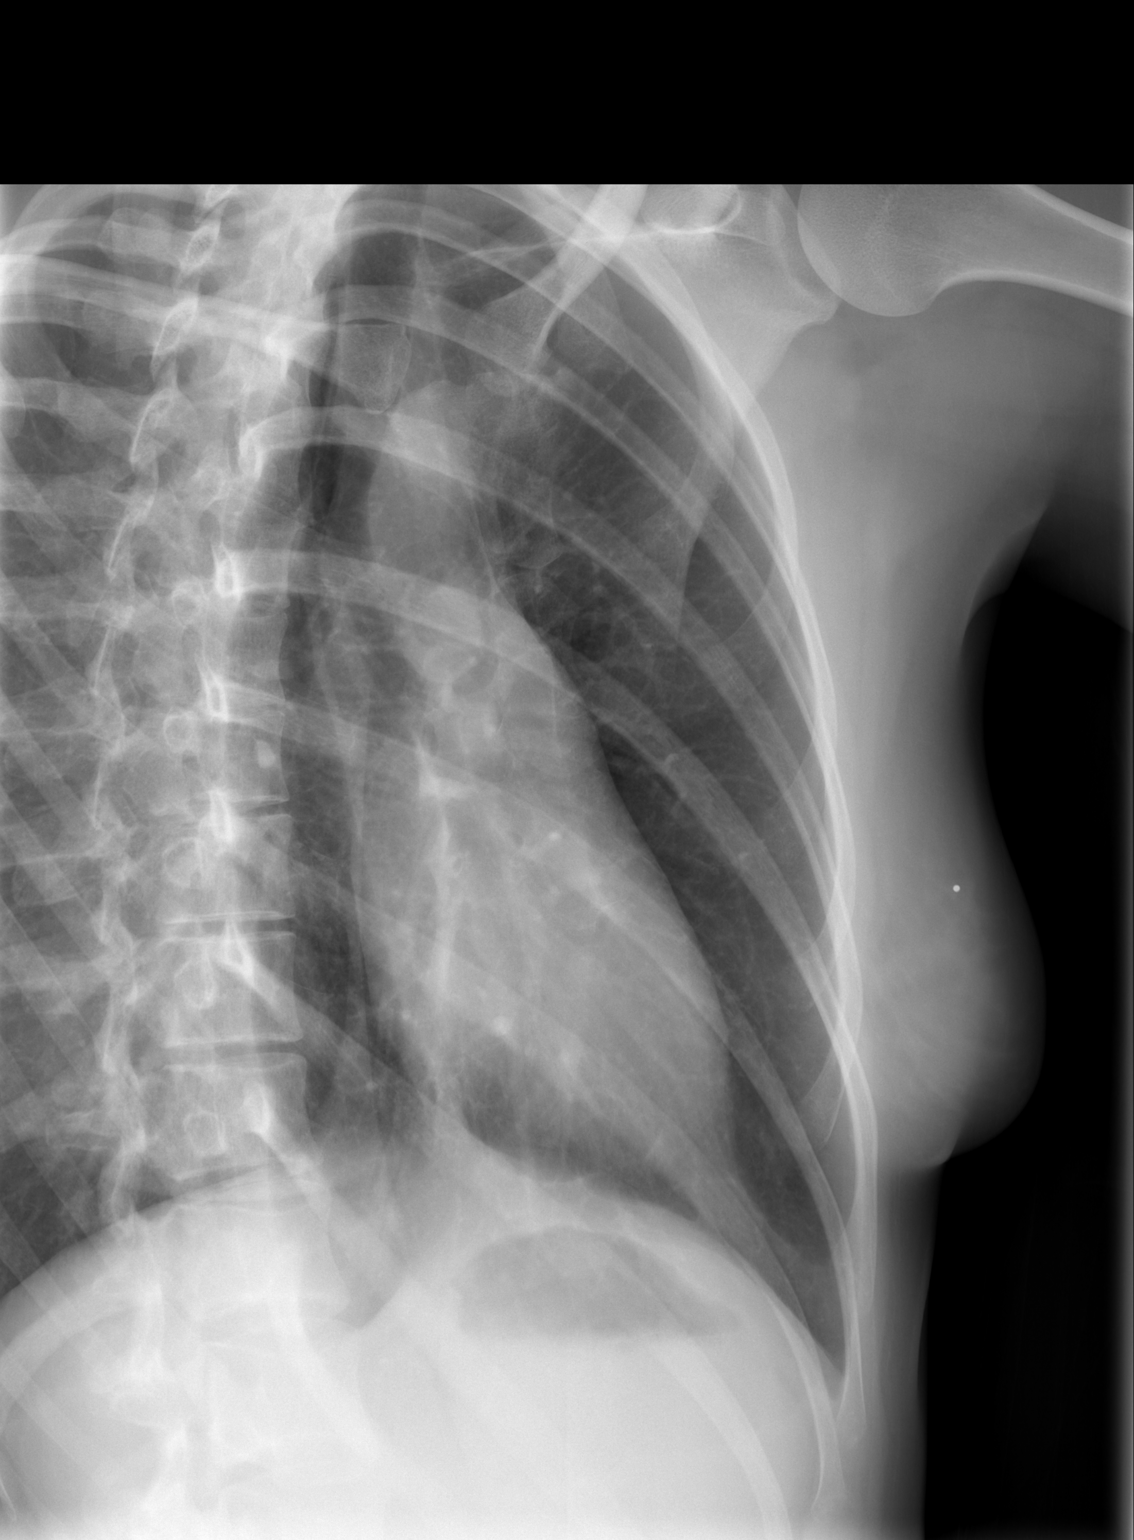

[3 of 3 positions shown; findings below may reference images not displayed]

FINDINGS: No fracture or other bone lesions are seen involving the ribs. There
is no evidence of pneumothorax or pleural effusion. Both lungs are
clear. Heart size and mediastinal contours are within normal limits.
IMPRESSION: Negative.

## 2018-12-07 DIAGNOSIS — F331 Major depressive disorder, recurrent, moderate: Secondary | ICD-10-CM | POA: Diagnosis not present

## 2018-12-26 DIAGNOSIS — R87612 Low grade squamous intraepithelial lesion on cytologic smear of cervix (LGSIL): Secondary | ICD-10-CM | POA: Diagnosis not present

## 2018-12-26 DIAGNOSIS — Z01419 Encounter for gynecological examination (general) (routine) without abnormal findings: Secondary | ICD-10-CM | POA: Diagnosis not present

## 2018-12-28 DIAGNOSIS — F331 Major depressive disorder, recurrent, moderate: Secondary | ICD-10-CM | POA: Diagnosis not present

## 2019-01-10 DIAGNOSIS — K120 Recurrent oral aphthae: Secondary | ICD-10-CM | POA: Diagnosis not present

## 2019-01-12 DIAGNOSIS — F331 Major depressive disorder, recurrent, moderate: Secondary | ICD-10-CM | POA: Diagnosis not present

## 2019-01-16 DIAGNOSIS — Z03818 Encounter for observation for suspected exposure to other biological agents ruled out: Secondary | ICD-10-CM | POA: Diagnosis not present

## 2019-01-16 DIAGNOSIS — U071 COVID-19: Secondary | ICD-10-CM | POA: Diagnosis not present

## 2019-01-25 DIAGNOSIS — F331 Major depressive disorder, recurrent, moderate: Secondary | ICD-10-CM | POA: Diagnosis not present

## 2019-01-26 DIAGNOSIS — F331 Major depressive disorder, recurrent, moderate: Secondary | ICD-10-CM | POA: Diagnosis not present

## 2019-01-29 DIAGNOSIS — Z20828 Contact with and (suspected) exposure to other viral communicable diseases: Secondary | ICD-10-CM | POA: Diagnosis not present

## 2019-01-29 DIAGNOSIS — Z03818 Encounter for observation for suspected exposure to other biological agents ruled out: Secondary | ICD-10-CM | POA: Diagnosis not present

## 2019-02-04 DIAGNOSIS — Z20828 Contact with and (suspected) exposure to other viral communicable diseases: Secondary | ICD-10-CM | POA: Diagnosis not present

## 2019-02-08 DIAGNOSIS — F331 Major depressive disorder, recurrent, moderate: Secondary | ICD-10-CM | POA: Diagnosis not present

## 2019-02-09 DIAGNOSIS — F331 Major depressive disorder, recurrent, moderate: Secondary | ICD-10-CM | POA: Diagnosis not present

## 2019-02-14 DIAGNOSIS — N870 Mild cervical dysplasia: Secondary | ICD-10-CM | POA: Diagnosis not present

## 2019-02-14 DIAGNOSIS — Z3202 Encounter for pregnancy test, result negative: Secondary | ICD-10-CM | POA: Diagnosis not present

## 2019-02-22 DIAGNOSIS — F331 Major depressive disorder, recurrent, moderate: Secondary | ICD-10-CM | POA: Diagnosis not present

## 2019-03-08 DIAGNOSIS — F331 Major depressive disorder, recurrent, moderate: Secondary | ICD-10-CM | POA: Diagnosis not present

## 2019-04-05 DIAGNOSIS — F331 Major depressive disorder, recurrent, moderate: Secondary | ICD-10-CM | POA: Diagnosis not present

## 2019-04-19 DIAGNOSIS — F331 Major depressive disorder, recurrent, moderate: Secondary | ICD-10-CM | POA: Diagnosis not present

## 2019-05-02 DIAGNOSIS — F331 Major depressive disorder, recurrent, moderate: Secondary | ICD-10-CM | POA: Diagnosis not present

## 2019-05-18 DIAGNOSIS — F331 Major depressive disorder, recurrent, moderate: Secondary | ICD-10-CM | POA: Diagnosis not present

## 2019-05-30 DIAGNOSIS — F331 Major depressive disorder, recurrent, moderate: Secondary | ICD-10-CM | POA: Diagnosis not present

## 2019-06-14 DIAGNOSIS — F331 Major depressive disorder, recurrent, moderate: Secondary | ICD-10-CM | POA: Diagnosis not present

## 2019-06-25 DIAGNOSIS — Z20828 Contact with and (suspected) exposure to other viral communicable diseases: Secondary | ICD-10-CM | POA: Diagnosis not present

## 2019-07-05 DIAGNOSIS — F331 Major depressive disorder, recurrent, moderate: Secondary | ICD-10-CM | POA: Diagnosis not present

## 2019-07-09 DIAGNOSIS — H6690 Otitis media, unspecified, unspecified ear: Secondary | ICD-10-CM | POA: Diagnosis not present

## 2019-07-15 DIAGNOSIS — M79602 Pain in left arm: Secondary | ICD-10-CM | POA: Diagnosis not present

## 2019-07-19 DIAGNOSIS — F331 Major depressive disorder, recurrent, moderate: Secondary | ICD-10-CM | POA: Diagnosis not present

## 2019-07-30 DIAGNOSIS — H6690 Otitis media, unspecified, unspecified ear: Secondary | ICD-10-CM | POA: Diagnosis not present

## 2019-08-02 DIAGNOSIS — F331 Major depressive disorder, recurrent, moderate: Secondary | ICD-10-CM | POA: Diagnosis not present

## 2019-08-23 DIAGNOSIS — F331 Major depressive disorder, recurrent, moderate: Secondary | ICD-10-CM | POA: Diagnosis not present

## 2019-09-06 DIAGNOSIS — F331 Major depressive disorder, recurrent, moderate: Secondary | ICD-10-CM | POA: Diagnosis not present

## 2019-09-12 DIAGNOSIS — H7293 Unspecified perforation of tympanic membrane, bilateral: Secondary | ICD-10-CM | POA: Diagnosis not present

## 2019-09-12 DIAGNOSIS — J45909 Unspecified asthma, uncomplicated: Secondary | ICD-10-CM | POA: Diagnosis not present

## 2019-09-12 DIAGNOSIS — F411 Generalized anxiety disorder: Secondary | ICD-10-CM | POA: Diagnosis not present

## 2019-09-19 DIAGNOSIS — F331 Major depressive disorder, recurrent, moderate: Secondary | ICD-10-CM | POA: Diagnosis not present

## 2019-10-02 DIAGNOSIS — E785 Hyperlipidemia, unspecified: Secondary | ICD-10-CM | POA: Diagnosis not present

## 2019-10-03 DIAGNOSIS — F331 Major depressive disorder, recurrent, moderate: Secondary | ICD-10-CM | POA: Diagnosis not present

## 2019-10-17 DIAGNOSIS — F331 Major depressive disorder, recurrent, moderate: Secondary | ICD-10-CM | POA: Diagnosis not present

## 2019-10-23 DIAGNOSIS — Z03818 Encounter for observation for suspected exposure to other biological agents ruled out: Secondary | ICD-10-CM | POA: Diagnosis not present

## 2019-11-13 DIAGNOSIS — F331 Major depressive disorder, recurrent, moderate: Secondary | ICD-10-CM | POA: Diagnosis not present

## 2019-11-20 DIAGNOSIS — D225 Melanocytic nevi of trunk: Secondary | ICD-10-CM | POA: Diagnosis not present

## 2019-11-20 DIAGNOSIS — D1801 Hemangioma of skin and subcutaneous tissue: Secondary | ICD-10-CM | POA: Diagnosis not present

## 2019-11-20 DIAGNOSIS — D2262 Melanocytic nevi of left upper limb, including shoulder: Secondary | ICD-10-CM | POA: Diagnosis not present

## 2019-11-20 DIAGNOSIS — D2261 Melanocytic nevi of right upper limb, including shoulder: Secondary | ICD-10-CM | POA: Diagnosis not present

## 2019-11-28 DIAGNOSIS — F331 Major depressive disorder, recurrent, moderate: Secondary | ICD-10-CM | POA: Diagnosis not present

## 2019-12-12 DIAGNOSIS — Z03818 Encounter for observation for suspected exposure to other biological agents ruled out: Secondary | ICD-10-CM | POA: Diagnosis not present

## 2019-12-13 DIAGNOSIS — J988 Other specified respiratory disorders: Secondary | ICD-10-CM | POA: Diagnosis not present

## 2019-12-13 DIAGNOSIS — F331 Major depressive disorder, recurrent, moderate: Secondary | ICD-10-CM | POA: Diagnosis not present

## 2019-12-13 DIAGNOSIS — Z03818 Encounter for observation for suspected exposure to other biological agents ruled out: Secondary | ICD-10-CM | POA: Diagnosis not present

## 2019-12-23 DIAGNOSIS — F331 Major depressive disorder, recurrent, moderate: Secondary | ICD-10-CM | POA: Diagnosis not present

## 2019-12-23 DIAGNOSIS — H900 Conductive hearing loss, bilateral: Secondary | ICD-10-CM | POA: Diagnosis not present

## 2019-12-23 DIAGNOSIS — H7293 Unspecified perforation of tympanic membrane, bilateral: Secondary | ICD-10-CM | POA: Diagnosis not present

## 2020-01-07 DIAGNOSIS — F331 Major depressive disorder, recurrent, moderate: Secondary | ICD-10-CM | POA: Diagnosis not present

## 2020-02-04 DIAGNOSIS — F331 Major depressive disorder, recurrent, moderate: Secondary | ICD-10-CM | POA: Diagnosis not present

## 2020-02-06 DIAGNOSIS — Z03818 Encounter for observation for suspected exposure to other biological agents ruled out: Secondary | ICD-10-CM | POA: Diagnosis not present

## 2020-02-12 DIAGNOSIS — H6502 Acute serous otitis media, left ear: Secondary | ICD-10-CM | POA: Diagnosis not present

## 2020-02-17 DIAGNOSIS — H9012 Conductive hearing loss, unilateral, left ear, with unrestricted hearing on the contralateral side: Secondary | ICD-10-CM | POA: Diagnosis not present

## 2020-02-17 DIAGNOSIS — H7293 Unspecified perforation of tympanic membrane, bilateral: Secondary | ICD-10-CM | POA: Diagnosis not present

## 2020-02-17 DIAGNOSIS — H65195 Other acute nonsuppurative otitis media, recurrent, left ear: Secondary | ICD-10-CM | POA: Diagnosis not present

## 2020-02-17 DIAGNOSIS — H9011 Conductive hearing loss, unilateral, right ear, with unrestricted hearing on the contralateral side: Secondary | ICD-10-CM | POA: Diagnosis not present

## 2020-02-17 DIAGNOSIS — H6691 Otitis media, unspecified, right ear: Secondary | ICD-10-CM | POA: Diagnosis not present

## 2020-02-25 DIAGNOSIS — Z0000 Encounter for general adult medical examination without abnormal findings: Secondary | ICD-10-CM | POA: Diagnosis not present

## 2020-02-25 DIAGNOSIS — Z124 Encounter for screening for malignant neoplasm of cervix: Secondary | ICD-10-CM | POA: Diagnosis not present

## 2020-03-03 DIAGNOSIS — F331 Major depressive disorder, recurrent, moderate: Secondary | ICD-10-CM | POA: Diagnosis not present

## 2020-03-16 DIAGNOSIS — H900 Conductive hearing loss, bilateral: Secondary | ICD-10-CM | POA: Diagnosis not present

## 2020-03-16 DIAGNOSIS — H7293 Unspecified perforation of tympanic membrane, bilateral: Secondary | ICD-10-CM | POA: Diagnosis not present

## 2020-03-16 DIAGNOSIS — Z8669 Personal history of other diseases of the nervous system and sense organs: Secondary | ICD-10-CM | POA: Diagnosis not present

## 2020-03-16 DIAGNOSIS — H7403 Tympanosclerosis, bilateral: Secondary | ICD-10-CM | POA: Diagnosis not present

## 2020-03-16 DIAGNOSIS — Z09 Encounter for follow-up examination after completed treatment for conditions other than malignant neoplasm: Secondary | ICD-10-CM | POA: Diagnosis not present

## 2020-03-17 DIAGNOSIS — F331 Major depressive disorder, recurrent, moderate: Secondary | ICD-10-CM | POA: Diagnosis not present

## 2020-04-09 DIAGNOSIS — F331 Major depressive disorder, recurrent, moderate: Secondary | ICD-10-CM | POA: Diagnosis not present

## 2020-04-16 DIAGNOSIS — H73893 Other specified disorders of tympanic membrane, bilateral: Secondary | ICD-10-CM | POA: Diagnosis not present

## 2020-04-23 DIAGNOSIS — Z03818 Encounter for observation for suspected exposure to other biological agents ruled out: Secondary | ICD-10-CM | POA: Diagnosis not present

## 2020-04-27 DIAGNOSIS — Z20822 Contact with and (suspected) exposure to covid-19: Secondary | ICD-10-CM | POA: Diagnosis not present

## 2020-04-28 DIAGNOSIS — F331 Major depressive disorder, recurrent, moderate: Secondary | ICD-10-CM | POA: Diagnosis not present

## 2020-05-12 DIAGNOSIS — H90A12 Conductive hearing loss, unilateral, left ear with restricted hearing on the contralateral side: Secondary | ICD-10-CM | POA: Diagnosis not present

## 2020-05-12 DIAGNOSIS — H7293 Unspecified perforation of tympanic membrane, bilateral: Secondary | ICD-10-CM | POA: Diagnosis not present

## 2020-05-18 DIAGNOSIS — F331 Major depressive disorder, recurrent, moderate: Secondary | ICD-10-CM | POA: Diagnosis not present

## 2020-06-09 DIAGNOSIS — H938X9 Other specified disorders of ear, unspecified ear: Secondary | ICD-10-CM | POA: Diagnosis not present

## 2020-06-09 DIAGNOSIS — H6990 Unspecified Eustachian tube disorder, unspecified ear: Secondary | ICD-10-CM | POA: Diagnosis not present

## 2020-06-12 DIAGNOSIS — F331 Major depressive disorder, recurrent, moderate: Secondary | ICD-10-CM | POA: Diagnosis not present

## 2020-06-20 DIAGNOSIS — M25511 Pain in right shoulder: Secondary | ICD-10-CM | POA: Diagnosis not present

## 2020-06-20 DIAGNOSIS — M542 Cervicalgia: Secondary | ICD-10-CM | POA: Diagnosis not present

## 2020-06-20 DIAGNOSIS — M25512 Pain in left shoulder: Secondary | ICD-10-CM | POA: Diagnosis not present

## 2020-06-22 DIAGNOSIS — R87612 Low grade squamous intraepithelial lesion on cytologic smear of cervix (LGSIL): Secondary | ICD-10-CM | POA: Diagnosis not present

## 2020-06-22 DIAGNOSIS — Z3202 Encounter for pregnancy test, result negative: Secondary | ICD-10-CM | POA: Diagnosis not present

## 2020-07-03 DIAGNOSIS — F331 Major depressive disorder, recurrent, moderate: Secondary | ICD-10-CM | POA: Diagnosis not present

## 2020-07-06 DIAGNOSIS — K921 Melena: Secondary | ICD-10-CM | POA: Diagnosis not present

## 2020-07-06 DIAGNOSIS — K649 Unspecified hemorrhoids: Secondary | ICD-10-CM | POA: Diagnosis not present

## 2020-07-07 DIAGNOSIS — Z9889 Other specified postprocedural states: Secondary | ICD-10-CM | POA: Diagnosis not present

## 2020-07-08 DIAGNOSIS — H9012 Conductive hearing loss, unilateral, left ear, with unrestricted hearing on the contralateral side: Secondary | ICD-10-CM | POA: Diagnosis not present

## 2020-07-08 DIAGNOSIS — H7192 Unspecified cholesteatoma, left ear: Secondary | ICD-10-CM | POA: Diagnosis not present

## 2020-07-08 DIAGNOSIS — M899 Disorder of bone, unspecified: Secondary | ICD-10-CM | POA: Diagnosis not present

## 2020-07-08 DIAGNOSIS — H7293 Unspecified perforation of tympanic membrane, bilateral: Secondary | ICD-10-CM | POA: Diagnosis not present

## 2020-07-08 DIAGNOSIS — H7422 Discontinuity and dislocation of left ear ossicles: Secondary | ICD-10-CM | POA: Diagnosis not present

## 2020-07-08 DIAGNOSIS — H902 Conductive hearing loss, unspecified: Secondary | ICD-10-CM | POA: Diagnosis not present

## 2020-07-08 DIAGNOSIS — H7292 Unspecified perforation of tympanic membrane, left ear: Secondary | ICD-10-CM | POA: Diagnosis not present

## 2020-07-08 DIAGNOSIS — H7102 Cholesteatoma of attic, left ear: Secondary | ICD-10-CM | POA: Diagnosis not present

## 2020-07-16 DIAGNOSIS — F331 Major depressive disorder, recurrent, moderate: Secondary | ICD-10-CM | POA: Diagnosis not present

## 2020-07-31 DIAGNOSIS — F331 Major depressive disorder, recurrent, moderate: Secondary | ICD-10-CM | POA: Diagnosis not present

## 2020-08-20 DIAGNOSIS — Z03818 Encounter for observation for suspected exposure to other biological agents ruled out: Secondary | ICD-10-CM | POA: Diagnosis not present

## 2020-08-21 DIAGNOSIS — F331 Major depressive disorder, recurrent, moderate: Secondary | ICD-10-CM | POA: Diagnosis not present

## 2020-08-27 DIAGNOSIS — Z03818 Encounter for observation for suspected exposure to other biological agents ruled out: Secondary | ICD-10-CM | POA: Diagnosis not present

## 2020-09-09 DIAGNOSIS — F331 Major depressive disorder, recurrent, moderate: Secondary | ICD-10-CM | POA: Diagnosis not present

## 2020-09-10 DIAGNOSIS — Z03818 Encounter for observation for suspected exposure to other biological agents ruled out: Secondary | ICD-10-CM | POA: Diagnosis not present

## 2020-09-17 DIAGNOSIS — Z03818 Encounter for observation for suspected exposure to other biological agents ruled out: Secondary | ICD-10-CM | POA: Diagnosis not present

## 2020-09-22 DIAGNOSIS — Z0000 Encounter for general adult medical examination without abnormal findings: Secondary | ICD-10-CM | POA: Diagnosis not present

## 2020-09-22 DIAGNOSIS — Z23 Encounter for immunization: Secondary | ICD-10-CM | POA: Diagnosis not present

## 2020-09-22 DIAGNOSIS — R946 Abnormal results of thyroid function studies: Secondary | ICD-10-CM | POA: Diagnosis not present

## 2020-09-22 DIAGNOSIS — Z13220 Encounter for screening for lipoid disorders: Secondary | ICD-10-CM | POA: Diagnosis not present

## 2020-09-23 DIAGNOSIS — F331 Major depressive disorder, recurrent, moderate: Secondary | ICD-10-CM | POA: Diagnosis not present

## 2020-10-07 DIAGNOSIS — F331 Major depressive disorder, recurrent, moderate: Secondary | ICD-10-CM | POA: Diagnosis not present

## 2020-10-13 DIAGNOSIS — H7291 Unspecified perforation of tympanic membrane, right ear: Secondary | ICD-10-CM | POA: Diagnosis not present

## 2020-10-13 DIAGNOSIS — H6980 Other specified disorders of Eustachian tube, unspecified ear: Secondary | ICD-10-CM | POA: Diagnosis not present

## 2020-10-13 DIAGNOSIS — H900 Conductive hearing loss, bilateral: Secondary | ICD-10-CM | POA: Diagnosis not present

## 2020-10-13 DIAGNOSIS — Z48810 Encounter for surgical aftercare following surgery on the sense organs: Secondary | ICD-10-CM | POA: Diagnosis not present

## 2020-10-13 DIAGNOSIS — Z8669 Personal history of other diseases of the nervous system and sense organs: Secondary | ICD-10-CM | POA: Diagnosis not present

## 2020-10-13 DIAGNOSIS — H7192 Unspecified cholesteatoma, left ear: Secondary | ICD-10-CM | POA: Diagnosis not present

## 2020-10-13 DIAGNOSIS — Z09 Encounter for follow-up examination after completed treatment for conditions other than malignant neoplasm: Secondary | ICD-10-CM | POA: Diagnosis not present

## 2020-10-13 DIAGNOSIS — Z9889 Other specified postprocedural states: Secondary | ICD-10-CM | POA: Diagnosis not present

## 2020-10-13 DIAGNOSIS — H7420 Discontinuity and dislocation of ear ossicles, unspecified ear: Secondary | ICD-10-CM | POA: Diagnosis not present

## 2020-10-21 DIAGNOSIS — F331 Major depressive disorder, recurrent, moderate: Secondary | ICD-10-CM | POA: Diagnosis not present

## 2020-10-26 DIAGNOSIS — Z20822 Contact with and (suspected) exposure to covid-19: Secondary | ICD-10-CM | POA: Diagnosis not present

## 2020-11-04 DIAGNOSIS — F331 Major depressive disorder, recurrent, moderate: Secondary | ICD-10-CM | POA: Diagnosis not present

## 2020-11-13 DIAGNOSIS — F331 Major depressive disorder, recurrent, moderate: Secondary | ICD-10-CM | POA: Diagnosis not present

## 2020-11-20 DIAGNOSIS — F331 Major depressive disorder, recurrent, moderate: Secondary | ICD-10-CM | POA: Diagnosis not present
